# Patient Record
Sex: Male | Born: 1970 | Race: White | Hispanic: No | Marital: Married | Smoking: Current every day smoker
Health system: Southern US, Community
[De-identification: ages and names within clinical notes are randomized; demographics above are authoritative.]

## PROBLEM LIST (undated history)

## (undated) HISTORY — PX: ANKLE SURGERY: SHX546

---

## 2000-12-26 ENCOUNTER — Encounter: Payer: Self-pay | Admitting: Emergency Medicine

## 2000-12-26 ENCOUNTER — Emergency Department (HOSPITAL_COMMUNITY): Admission: EM | Admit: 2000-12-26 | Discharge: 2000-12-26 | Payer: Self-pay | Admitting: Emergency Medicine

## 2001-01-06 ENCOUNTER — Emergency Department (HOSPITAL_COMMUNITY): Admission: EM | Admit: 2001-01-06 | Discharge: 2001-01-06 | Payer: Self-pay | Admitting: Emergency Medicine

## 2009-07-01 ENCOUNTER — Emergency Department (HOSPITAL_COMMUNITY): Admission: EM | Admit: 2009-07-01 | Discharge: 2009-07-01 | Payer: Self-pay | Admitting: Emergency Medicine

## 2010-12-12 LAB — CLOSTRIDIUM DIFFICILE EIA: C difficile Toxins A+B, EIA: NEGATIVE

## 2010-12-12 LAB — CBC
MCHC: 34.3 g/dL (ref 30.0–36.0)
MCV: 89.7 fL (ref 78.0–100.0)
RBC: 4.56 MIL/uL (ref 4.22–5.81)
RDW: 13.8 % (ref 11.5–15.5)

## 2010-12-12 LAB — COMPREHENSIVE METABOLIC PANEL
ALT: 29 U/L (ref 0–53)
AST: 24 U/L (ref 0–37)
CO2: 30 mEq/L (ref 19–32)
Calcium: 9 mg/dL (ref 8.4–10.5)
Creatinine, Ser: 1.09 mg/dL (ref 0.4–1.5)
GFR calc Af Amer: >60 mL/min (ref >60)
GFR calc non Af Amer: >60 mL/min (ref >60)
Glucose, Bld: 105 mg/dL — ABNORMAL HIGH (ref 70–99)
Sodium: 136 mEq/L (ref 135–145)
Total Protein: 6.7 g/dL (ref 6.0–8.3)

## 2010-12-12 LAB — URINALYSIS, ROUTINE W REFLEX MICROSCOPIC
Bilirubin Urine: NEGATIVE
Glucose, UA: NEGATIVE mg/dL
Ketones, ur: NEGATIVE mg/dL
Leukocytes, UA: NEGATIVE
Nitrite: NEGATIVE
Protein, ur: NEGATIVE mg/dL
Specific Gravity, Urine: 1.01 (ref 1.005–1.030)
Urobilinogen, UA: 0.2 mg/dL (ref 0.0–1.0)
pH: 6 (ref 5.0–8.0)

## 2010-12-12 LAB — DIFFERENTIAL
Lymphocytes Relative: 28 % (ref 12–46)
Lymphs Abs: 1.2 10*3/uL (ref 0.7–4.0)
Monocytes Relative: 16 % — ABNORMAL HIGH (ref 3–12)
Neutrophils Relative %: 55 % (ref 43–77)

## 2010-12-12 LAB — STOOL CULTURE

## 2010-12-12 LAB — OVA AND PARASITE EXAMINATION: Ova and parasites: NONE SEEN

## 2010-12-12 LAB — URINE MICROSCOPIC-ADD ON

## 2010-12-12 LAB — LIPASE, BLOOD: Lipase: 14 U/L (ref 11–59)

## 2011-04-04 ENCOUNTER — Emergency Department (HOSPITAL_COMMUNITY): Payer: 59

## 2011-04-04 ENCOUNTER — Emergency Department (HOSPITAL_COMMUNITY)
Admission: EM | Admit: 2011-04-04 | Discharge: 2011-04-04 | Disposition: A | Discharge status: Home / Self Care | Payer: 59 | Attending: Emergency Medicine | Admitting: Emergency Medicine

## 2011-04-04 ENCOUNTER — Encounter: Payer: Self-pay | Admitting: *Deleted

## 2011-04-04 DIAGNOSIS — J069 Acute upper respiratory infection, unspecified: Secondary | ICD-10-CM

## 2011-04-04 DIAGNOSIS — J40 Bronchitis, not specified as acute or chronic: Secondary | ICD-10-CM | POA: Insufficient documentation

## 2011-04-04 DIAGNOSIS — F17200 Nicotine dependence, unspecified, uncomplicated: Secondary | ICD-10-CM | POA: Insufficient documentation

## 2011-04-04 MED ORDER — ALBUTEROL SULFATE HFA 108 (90 BASE) MCG/ACT IN AERS: 2.0000 | INHALATION_SPRAY | RESPIRATORY_TRACT | Status: AC | PRN

## 2011-04-04 MED ORDER — DOXYCYCLINE HYCLATE 100 MG PO CAPS: 100.0000 mg | ORAL_CAPSULE | Freq: Two times a day (BID) | ORAL | Status: AC

## 2011-04-04 MED ORDER — ALBUTEROL SULFATE (5 MG/ML) 0.5% IN NEBU
2.5000 mg | INHALATION_SOLUTION | Freq: Once | RESPIRATORY_TRACT | Status: AC
  Administered 2011-04-04: 2.5 mg via RESPIRATORY_TRACT
  Filled 2011-04-04: qty 0.5

## 2011-04-04 MED ORDER — DOXYCYCLINE HYCLATE 100 MG PO TABS
100.0000 mg | ORAL_TABLET | Freq: Once | ORAL | Status: AC
  Administered 2011-04-04: 100 mg via ORAL
  Filled 2011-04-04: qty 1

## 2011-04-04 NOTE — ED Notes (Signed)
Productive cough since last Friday, geen in color at times. Coughed up blood last night. Called PMD and was told to come to ED.

## 2011-04-04 NOTE — ED Notes (Signed)
meds given. Rt in to perform tx at this time. nad

## 2011-04-04 NOTE — ED Provider Notes (Signed)
History     Chief Complaint  Patient presents with  . Hemoptysis   Patient is a 40 y.o. male presenting with cough. The history is provided by the patient.  Cough The current episode started more than 1 week ago. The problem has been gradually worsening. The cough is productive of blood-tinged sputum. There has been no fever. Associated symptoms include chest pain and wheezing. Pertinent negatives include no chills, no headaches and no shortness of breath. He has tried nothing for the symptoms. He is a smoker. His past medical history does not include bronchitis, pneumonia, COPD, emphysema or asthma.    History reviewed. No pertinent past medical history.  Past Surgical History  Procedure Date  . Ankle surgery     No family history on file.  History  Substance Use Topics  . Smoking status: Current Everyday Smoker  . Smokeless tobacco: Not on file  . Alcohol Use: Yes     Occ      Review of Systems  Constitutional: Negative for chills and activity change.       All ROS Neg except as noted in HPI  HENT: Negative for nosebleeds and neck pain.   Eyes: Negative for photophobia and discharge.  Respiratory: Positive for cough and wheezing. Negative for shortness of breath.   Cardiovascular: Positive for chest pain. Negative for palpitations.  Gastrointestinal: Negative for abdominal pain and blood in stool.  Genitourinary: Negative for dysuria, frequency and hematuria.  Musculoskeletal: Negative for back pain and arthralgias.  Skin: Negative.   Neurological: Negative for dizziness, seizures, speech difficulty and headaches.  Psychiatric/Behavioral: Negative for hallucinations and confusion.    Physical Exam  BP 134/74  Pulse 83  Temp(Src) 98.5 F (36.9 C) (Oral)  Resp 16  Ht 5\' 11"  (1.803 m)  Wt 190 lb (86.183 kg)  BMI 26.50 kg/m2  SpO2 98%  Physical Exam  Nursing note and vitals reviewed. Constitutional: He is oriented to person, place, and time. He appears  well-developed and well-nourished.  Non-toxic appearance.  HENT:  Head: Normocephalic.  Right Ear: Tympanic membrane and external ear normal.  Left Ear: Tympanic membrane and external ear normal.  Eyes: EOM and lids are normal. Pupils are equal, round, and reactive to light.       Nasal congestion present.  Neck: Normal range of motion. Neck supple. Carotid bruit is not present.  Cardiovascular: Normal rate, regular rhythm, normal heart sounds, intact distal pulses and normal pulses.   Pulmonary/Chest: No respiratory distress. He has wheezes. He has rhonchi in the right upper field, the right middle field, the right lower field, the left upper field, the left middle field and the left lower field. He has no rales.  Abdominal: Soft. Bowel sounds are normal. There is no tenderness. There is no guarding.  Musculoskeletal: Normal range of motion.  Lymphadenopathy:       Head (right side): No submandibular adenopathy present.       Head (left side): No submandibular adenopathy present.    He has no cervical adenopathy.  Neurological: He is alert and oriented to person, place, and time. He has normal strength. No cranial nerve deficit or sensory deficit.  Skin: Skin is warm and dry.  Psychiatric: He has a normal mood and affect. His speech is normal.    ED Course  Procedures  MDM I have reviewed nursing notes, vital signs, and all appropriate lab and imaging results for this patient.      Kathie Dike, [place] 04/04/11 1308

## 2011-04-10 NOTE — ED Provider Notes (Signed)
Medical screening examination/treatment/procedure(s) were performed by non-physician practitioner and as supervising physician I was immediately available for consultation/collaboration.   Joya Gaskins, MD 04/10/11 [phone]

## 2012-05-30 ENCOUNTER — Encounter (HOSPITAL_COMMUNITY): Payer: Self-pay | Admitting: Emergency Medicine

## 2012-05-30 ENCOUNTER — Emergency Department (HOSPITAL_COMMUNITY)
Admission: EM | Admit: 2012-05-30 | Discharge: 2012-05-30 | Disposition: A | Discharge status: Home / Self Care | Payer: 59 | Source: Home / Self Care | Attending: Emergency Medicine | Admitting: Emergency Medicine

## 2012-05-30 DIAGNOSIS — G43909 Migraine, unspecified, not intractable, without status migrainosus: Secondary | ICD-10-CM

## 2012-05-30 LAB — POCT I-STAT, CHEM 8
BUN: 17 mg/dL (ref 6–23)
Chloride: 101 mEq/L (ref 96–112)
Creatinine, Ser: 1.3 mg/dL (ref 0.50–1.35)
Sodium: 140 mEq/L (ref 135–145)

## 2012-05-30 MED ORDER — AMITRIPTYLINE HCL 25 MG PO TABS: 25.0000 mg | ORAL_TABLET | Freq: Every day | ORAL | Status: AC

## 2012-05-30 MED ORDER — NAPROXEN 500 MG PO TABS: ORAL_TABLET | ORAL | Status: AC

## 2012-05-30 MED ORDER — METOCLOPRAMIDE HCL 10 MG PO TABS: ORAL_TABLET | ORAL | Status: AC

## 2012-05-30 NOTE — ED Notes (Signed)
Pt states that symptom of nausea and headaches started on Monday. Pt states that it comes/goes. Nausea is felt in upper region of stomach. Pt states that symptoms worsened last night before and after eating and had some feelings of shakiness. Today on the way in had an episode of blurred vision. Hx of intestinal infection two years ago with similar symptoms. Pt denies any other symptoms.

## 2012-05-30 NOTE — ED Provider Notes (Signed)
Chief Complaint  Patient presents with  . Nausea    nausea with headaches    History of Present Illness:   The patient is a 41 year old male who has had a seven-day history of recurring episodes of headache and nausea. The headache and nausea always occur together. The headache is described as bandlike and is mild in intensity rated 3-4/10. It's not throbbing. There no specific precipitating factors or relieving factors. It can last from 30-45 minutes all day at a time and may occur several times a day. Nothing makes it better or worse. He's tried Goody's and BCs without much relief. There is no associated vomiting, photophobia, phonophobia, or worsening with activity and is not better with rest. The headache is always accompanied by nausea but no vomiting. He also feels tremulous. He one episode of visual blurring today lasting about 5 seconds. His energy is diminished and he feels tired and rundown. He denies any fever, chills, or stiff neck. He has had no diplopia, nasal congestion, rhinorrhea, sore throat, or adenopathy. He denies any paresthesias, muscle weakness, difficulty with speech, swallowing, ambulation, or episodes of loss of consciousness. He is otherwise in good health and is not taking any other medications. He does admit to being under some stress due to some family problems. He denies any depression or anxiety. He denies any abdominal pain or diarrhea.  Review of Systems:  Other than noted above, the patient denies any of the following symptoms: Systemic:  No fever, chills, fatigue, photophobia, stiff neck. Eye:  No redness, eye pain, discharge, blurred vision, or diplopia. ENT:  No nasal congestion, rhinorrhea, sinus pressure or pain, sneezing, earache, or sore throat.  No jaw claudication. Neuro:  No paresthesias, loss of consciousness, seizure activity, muscle weakness, trouble with coordination or gait, trouble speaking or swallowing. Psych:  No depression, anxiety or trouble  sleeping.  PMFSH:  Past medical history, family history, social history, meds, and allergies were reviewed.  Physical Exam:   Vital signs:  BP 116/77  Pulse 57  Temp 98.1 F (36.7 C) (Oral)  Resp 16  SpO2 98% General:  Alert and oriented.  In no distress. Eye:  Lids and conjunctivas normal.  PERRL,  Full EOMs.  Fundi benign with normal discs and vessels. ENT:  No cranial or facial tenderness to palpation.  TMs and canals clear.  Nasal mucosa was normal and uncongested without any drainage. No intra oral lesions, pharynx clear, mucous membranes moist, dentition normal. Neck:  Supple, full ROM, no tenderness to palpation.  No adenopathy or mass. Neuro:  Alert and orented times 3.  Speech was clear, fluent, and appropriate.  Cranial nerves intact. No pronator drift, muscle strength normal. Finger to nose normal.  DTRs were 2+ and symmetrical.Station and gait were normal.  Romberg's sign was normal.  Able to perform tandem gait well. Psych:  Normal affect.  Assessment:  The encounter diagnosis was Migraine headache.  This sounds like a migraine equivalent, although it may well be a tension type headache.  Plan:   1.  The following meds were prescribed:   New Prescriptions   AMITRIPTYLINE (ELAVIL) 25 MG TABLET    Take 1 tablet (25 mg total) by mouth at bedtime.   METOCLOPRAMIDE (REGLAN) 10 MG TABLET    1 every 6 hours as needed for nausea.   NAPROXEN (NAPROSYN) 500 MG TABLET    1 every 12 hours as needed for headache.   2.  The patient was instructed in symptomatic care and handouts  were given. 3.  The patient was told to return if becoming worse in any way, if no better in 3 or 4 days, and given some red flag symptoms that would indicate earlier return.  Follow up:  The patient was told to follow up with Dr. Karenann Cai in one week.     Reuben Likes, MD 05/30/12 2022

## 2012-09-26 IMAGING — CR DG CHEST 2V
2 series · 2 of 2 positions shown · non-contrast
Comparison: None

CLINICAL DATA: Hemoptysis.

CHEST - 2 VIEW

[view not recorded (1 of 2)]
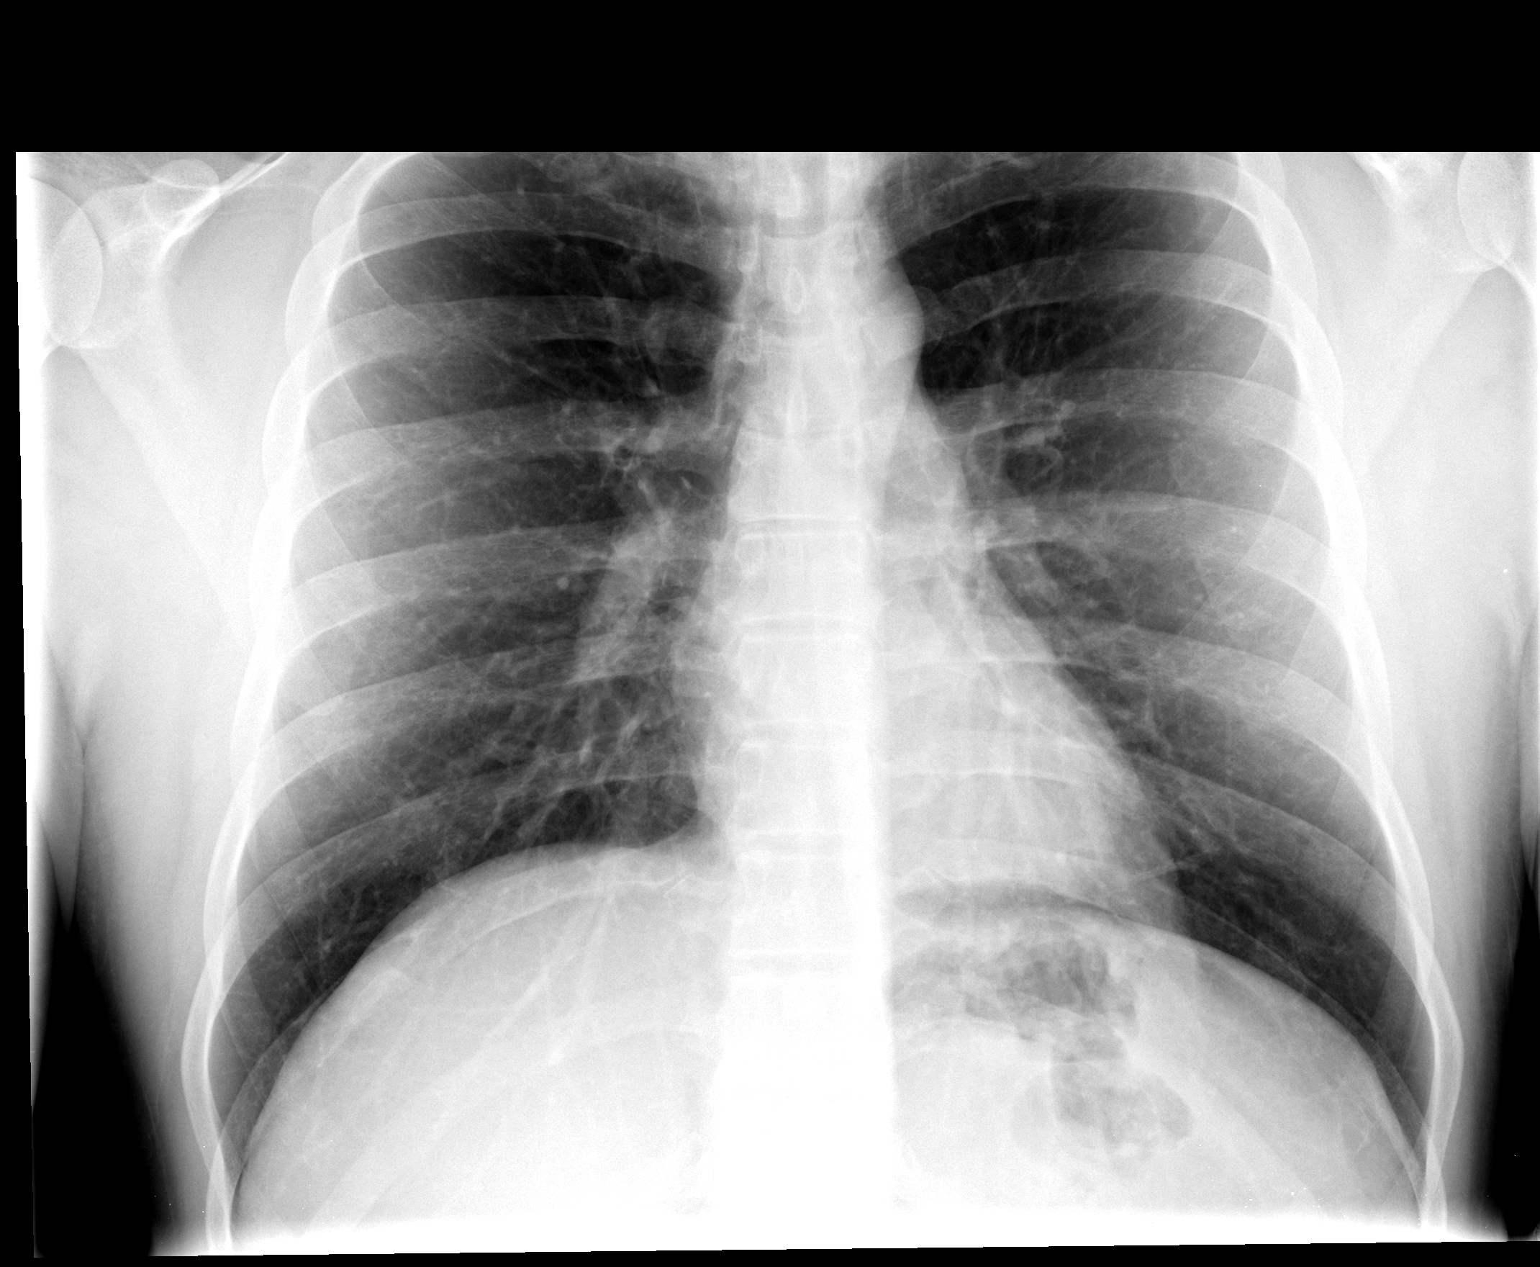

[view not recorded (2 of 2)]
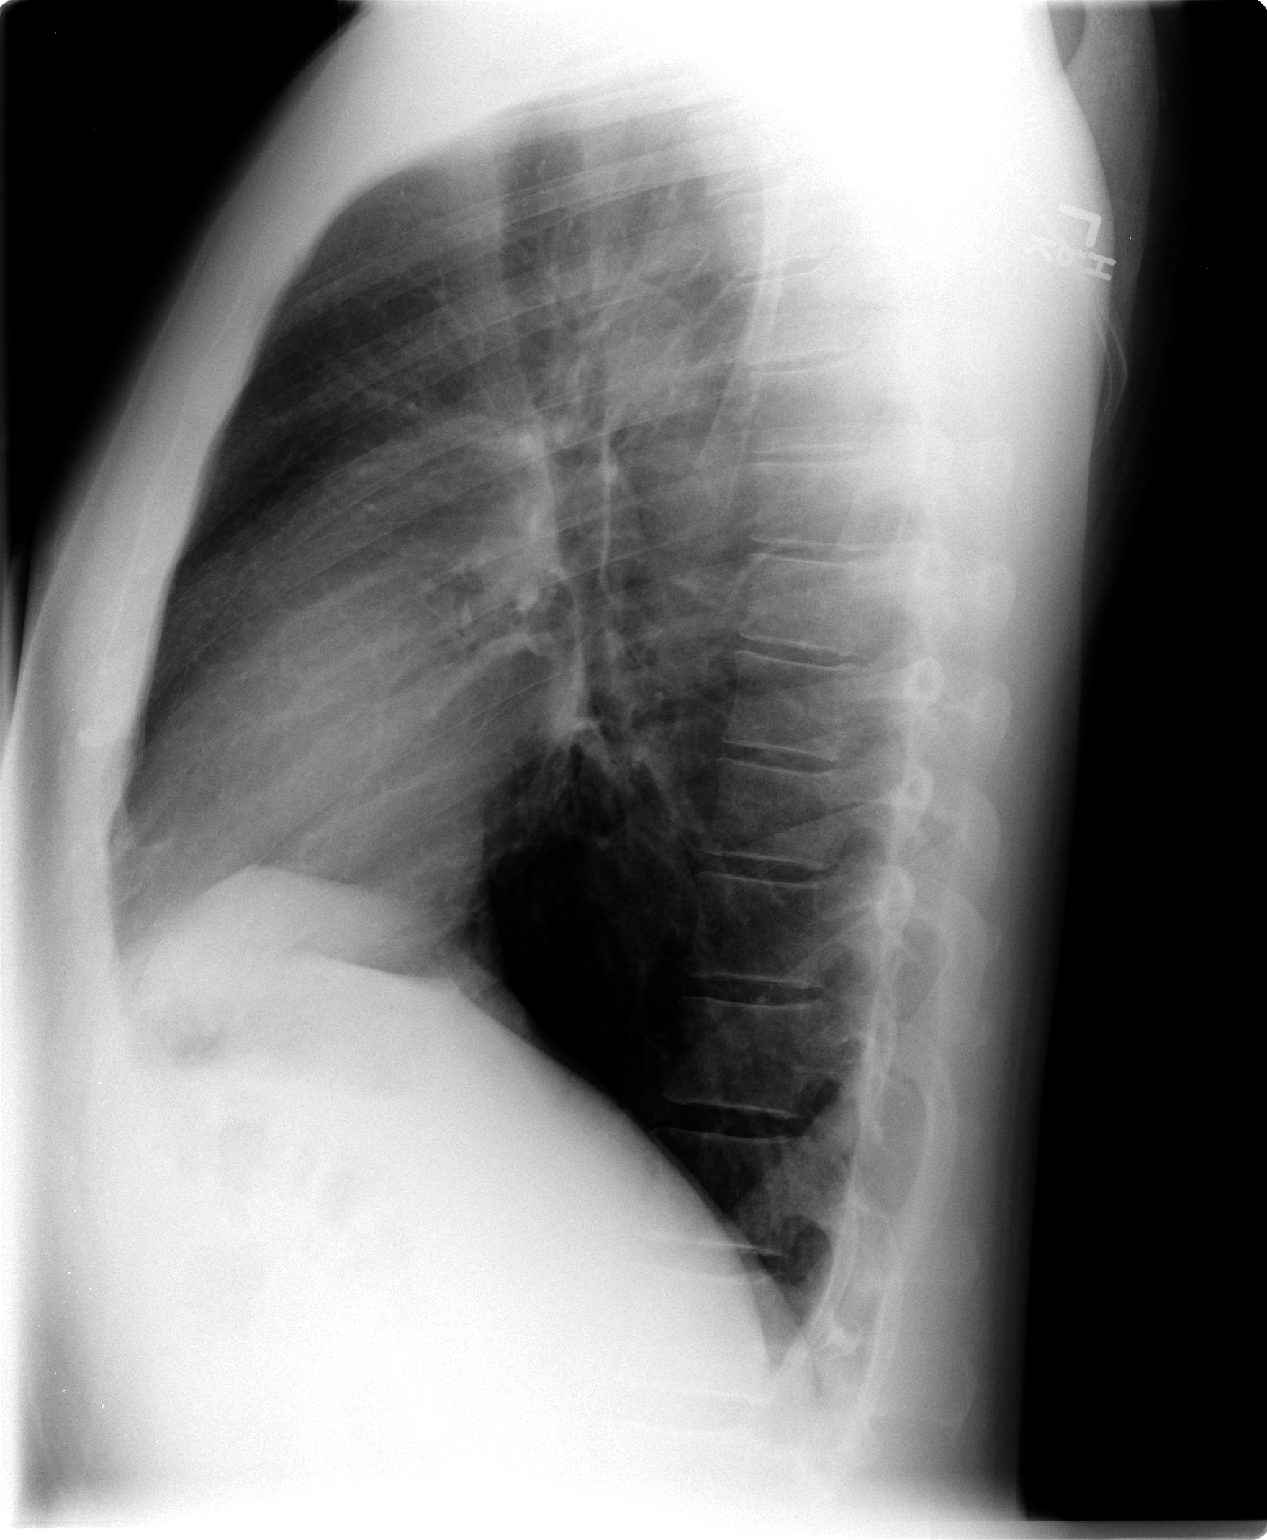

[2 of 2 positions shown; findings below may reference images not displayed]

FINDINGS: The cardiac silhouette, mediastinal and hilar contours
are within normal limits.  The lungs are clear.  The bony thorax is
intact.
IMPRESSION: No acute cardiopulmonary findings.

## 2018-11-24 ENCOUNTER — Encounter (HOSPITAL_COMMUNITY): Payer: Self-pay | Admitting: *Deleted

## 2018-11-24 ENCOUNTER — Other Ambulatory Visit: Payer: Self-pay

## 2018-11-24 ENCOUNTER — Emergency Department (HOSPITAL_COMMUNITY)
Admission: EM | Admit: 2018-11-24 | Discharge: 2018-11-24 | Disposition: A | Discharge status: Home / Self Care | Payer: Self-pay | Attending: Emergency Medicine | Admitting: Emergency Medicine

## 2018-11-24 DIAGNOSIS — R112 Nausea with vomiting, unspecified: Secondary | ICD-10-CM | POA: Insufficient documentation

## 2018-11-24 DIAGNOSIS — F17210 Nicotine dependence, cigarettes, uncomplicated: Secondary | ICD-10-CM | POA: Insufficient documentation

## 2018-11-24 DIAGNOSIS — Z79899 Other long term (current) drug therapy: Secondary | ICD-10-CM | POA: Insufficient documentation

## 2018-11-24 DIAGNOSIS — R197 Diarrhea, unspecified: Secondary | ICD-10-CM | POA: Insufficient documentation

## 2018-11-24 LAB — COMPREHENSIVE METABOLIC PANEL
ALT: 28 U/L (ref 0–44)
ANION GAP: 9 (ref 5–15)
AST: 19 U/L (ref 15–41)
Albumin: 3.9 g/dL (ref 3.5–5.0)
Alkaline Phosphatase: 87 U/L (ref 38–126)
BUN: 17 mg/dL (ref 6–20)
CHLORIDE: 104 mmol/L (ref 98–111)
CO2: 23 mmol/L (ref 22–32)
CREATININE: 1.14 mg/dL (ref 0.61–1.24)
Calcium: 9 mg/dL (ref 8.9–10.3)
Glucose, Bld: 108 mg/dL — ABNORMAL HIGH (ref 70–99)
POTASSIUM: 4.1 mmol/L (ref 3.5–5.1)
Sodium: 136 mmol/L (ref 135–145)
Total Bilirubin: 0.6 mg/dL (ref 0.3–1.2)
Total Protein: 6.7 g/dL (ref 6.5–8.1)

## 2018-11-24 LAB — CBC WITH DIFFERENTIAL/PLATELET
Abs Immature Granulocytes: 0.02 10*3/uL (ref 0.00–0.07)
BASOS PCT: 0 %
Basophils Absolute: 0 10*3/uL (ref 0.0–0.1)
Eosinophils Absolute: 0 10*3/uL (ref 0.0–0.5)
Eosinophils Relative: 1 %
HCT: 45.3 % (ref 39.0–52.0)
Hemoglobin: 14.9 g/dL (ref 13.0–17.0)
Immature Granulocytes: 0 %
Lymphocytes Relative: 8 %
Lymphs Abs: 0.6 10*3/uL — ABNORMAL LOW (ref 0.7–4.0)
MCH: 29 pg (ref 26.0–34.0)
MCHC: 32.9 g/dL (ref 30.0–36.0)
MCV: 88.1 fL (ref 80.0–100.0)
Monocytes Absolute: 0.5 10*3/uL (ref 0.1–1.0)
Monocytes Relative: 6 %
NEUTROS ABS: 6.3 10*3/uL (ref 1.7–7.7)
NRBC: 0 % (ref 0.0–0.2)
Neutrophils Relative %: 85 %
PLATELETS: 252 10*3/uL (ref 150–400)
RBC: 5.14 MIL/uL (ref 4.22–5.81)
RDW: 13 % (ref 11.5–15.5)
WBC: 7.4 10*3/uL (ref 4.0–10.5)

## 2018-11-24 LAB — LIPASE, BLOOD: LIPASE: 20 U/L (ref 11–51)

## 2018-11-24 MED ORDER — ONDANSETRON 4 MG PO TBDP
8.0000 mg | ORAL_TABLET | Freq: Once | ORAL | Status: AC
  Administered 2018-11-24: 8 mg via ORAL
  Filled 2018-11-24: qty 2

## 2018-11-24 NOTE — ED Provider Notes (Signed)
[facility] EMERGENCY DEPARTMENT Provider Note   CSN: [id number] Arrival date & time: 11/24/18  1042    History   Chief Complaint Chief Complaint  Patient presents with  . Nausea  . Emesis    HPI Victor Rice is a 48 y.o. male who presents abdominal pain, nausea, vomiting, diarrhea.  No significant past medical history.  Patient states that he woke up at around 1:30 AM and started to have diarrhea.  His hip was hurting somewhat which he has had before and his girlfriend gave him some ibuprofen he was able to go back to sleep.  He then woke up again around 4 AM and still started to have nausea with a couple episodes of vomiting and more diarrhea.  Emesis was nonbloody, nonbilious.  Diarrhea was nonbloody.  He reports associated generalized abdominal cramping.  This has improved although he feels soreness all over.  He states he has never had these symptoms before so he decided to come to the ED.  He denies any fever, chills, chest pain, shortness of breath, coughing, urinary symptoms.  No known sick contacts.  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     HPI  History reviewed. No pertinent past medical history.  There are no active problems to display for this patient.   Past Surgical History:  Procedure Laterality Date  . ANKLE SURGERY          Home Medications    Prior to Admission medications   Medication Sig Start Date End Date Taking? Authorizing Provider  albuterol (PROVENTIL HFA;VENTOLIN HFA) 108 (90 BASE) MCG/ACT inhaler Inhale 2 puffs into the lungs every 4 (four) hours as needed for wheezing. 04/04/11 04/03/12  Ivery Quale, PA-C  amitriptyline (ELAVIL) 25 MG tablet Take 1 tablet (25 mg total) by mouth at bedtime. 05/30/12   Reuben Likes, MD  Aspirin-Caffeine [phone] MG PACK Take 1 packet by mouth every 8 (eight) hours as needed. For pain     [provider]  metoCLOPramide (REGLAN) 10 MG tablet 1 every 6 hours as needed  for nausea. 05/30/12   Reuben Likes, MD  naproxen (NAPROSYN) 500 MG tablet 1 every 12 hours as needed for headache. 05/30/12   Reuben Likes, MD    Family History Family History  Problem Relation Age of Onset  . Diabetes Other   . Cancer Other   . Hypertension Other   . Aneurysm Other     Social History Social History   Tobacco Use  . Smoking status: Current Every Day Smoker    Types: Cigarettes  . Smokeless tobacco: Current User  Substance Use Topics  . Alcohol use: Yes    Comment: Occ  . Drug use: No     Allergies   Patient has no known allergies.   Review of Systems Review of Systems  Constitutional: Negative for fever.  Respiratory: Negative for shortness of breath.   Cardiovascular: Negative for chest pain.  Gastrointestinal: Positive for abdominal pain, diarrhea, nausea and vomiting. Negative for blood in stool.  Musculoskeletal: Positive for arthralgias.  Allergic/Immunologic: Negative for immunocompromised state.  All other systems reviewed and are negative.    Physical Exam Updated Vital Signs BP 127/86 (BP Location: Right Arm)   Pulse (!) 103   Temp 98.6 F (37 C) (Oral)   Ht 5\' 10"  (1.778 m)   Wt 90.7 kg   SpO2 95%   BMI 28.70 kg/m   Physical Exam Vitals signs and  nursing note reviewed.  Constitutional:      General: He is not in acute distress.    Appearance: Normal appearance. He is well-developed. He is not ill-appearing.  HENT:     Head: Normocephalic and atraumatic.  Eyes:     General: No scleral icterus.       Right eye: No discharge.        Left eye: No discharge.     Conjunctiva/sclera: Conjunctivae normal.     Pupils: Pupils are equal, round, and reactive to light.  Neck:     Musculoskeletal: Normal range of motion.  Cardiovascular:     Rate and Rhythm: Normal rate and regular rhythm.  Pulmonary:     Effort: Pulmonary effort is normal. No respiratory distress.     Breath sounds: Normal breath sounds.  Abdominal:      General: There is no distension.     Palpations: Abdomen is soft.     Tenderness: There is no abdominal tenderness.  Skin:    General: Skin is warm and dry.  Neurological:     Mental Status: He is alert and oriented to person, place, and time.  Psychiatric:        Behavior: Behavior normal.      ED Treatments / Results  Labs (all labs ordered are listed, but only abnormal results are displayed) Labs Reviewed  CBC WITH DIFFERENTIAL/PLATELET - Abnormal; Notable for the following components:      Result Value   Lymphs Abs 0.6 (*)    All other components within normal limits  COMPREHENSIVE METABOLIC PANEL - Abnormal; Notable for the following components:   Glucose, Bld 108 (*)    All other components within normal limits  LIPASE, BLOOD    EKG None  Radiology No results found.  Procedures Procedures (including critical care time)  Medications Ordered in ED Medications  ondansetron (ZOFRAN-ODT) disintegrating tablet 8 mg (8 mg Oral Given 11/24/18 1147)     Initial Impression / Assessment and Plan / ED Course  I have reviewed the triage vital signs and the nursing notes.  Pertinent labs & imaging results that were available during my care of the patient were reviewed by me and considered in my medical decision making (see chart for details).  48 year old male with nausea, vomiting, diarrhea and generalized abdominal cramping since early this morning.  Suspect either food poisoning versus viral gastroenteritis.  He had mild tachycardia in triage however this is resolved on my exam.  Heart is regular rate and rhythm.  Lungs are clear to auscultation.  Does not look particularly dehydrated.  His abdomen is soft and nontender.  Do not think he needs any imaging today as he has no focal tenderness and has a diarrheal illness. Will check basic labs and give him oral Zofran and p.o. challenge.  Labs are normal.  Patient tolerated p.o.  He is ready to go.  Advised return if worsening   Final Clinical Impressions(s) / ED Diagnoses   Final diagnoses:  Nausea vomiting and diarrhea    ED Discharge Orders    None       Bethel Born, PA-C 11/24/18 1219    Derwood Kaplan, MD 11/24/18 1315

## 2018-11-24 NOTE — Discharge Instructions (Signed)
Drink plenty of fluids Return if worsening

## 2018-11-24 NOTE — ED Triage Notes (Signed)
Pt reports waking up with N/V/D . No other family members are sick.

## 2018-11-24 NOTE — ED Notes (Signed)
Patient verbalizes understanding of discharge instructions . Opportunity for questions and answers were provided . Armband removed by staff ,Pt discharged from ED. W/C  offered at D/C  and Declined W/C at D/C and was escorted to lobby by RN.
